# Patient Record
Sex: Male | Born: 1995 | Race: White | Hispanic: No | Marital: Single | Smoking: Current every day smoker
Health system: Southern US, Community
[De-identification: ages and names within clinical notes are randomized; demographics above are authoritative.]

## PROBLEM LIST (undated history)

## (undated) DIAGNOSIS — J069 Acute upper respiratory infection, unspecified: Secondary | ICD-10-CM

## (undated) DIAGNOSIS — R079 Chest pain, unspecified: Secondary | ICD-10-CM

## (undated) HISTORY — DX: Acute upper respiratory infection, unspecified: J06.9

## (undated) HISTORY — DX: Chest pain, unspecified: R07.9

---
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
## 2013-01-01 ENCOUNTER — Encounter: Payer: Self-pay | Admitting: *Deleted

## 2013-01-04 ENCOUNTER — Ambulatory Visit: Payer: Self-pay | Admitting: Family Medicine

## 2013-02-16 ENCOUNTER — Emergency Department (HOSPITAL_BASED_OUTPATIENT_CLINIC_OR_DEPARTMENT_OTHER)
Admission: EM | Admit: 2013-02-16 | Discharge: 2013-02-16 | Disposition: A | Discharge status: Home / Self Care | Payer: Managed Care, Other (non HMO) | Attending: Emergency Medicine | Admitting: Emergency Medicine

## 2013-02-16 ENCOUNTER — Encounter (HOSPITAL_BASED_OUTPATIENT_CLINIC_OR_DEPARTMENT_OTHER): Payer: Self-pay | Admitting: *Deleted

## 2013-02-16 DIAGNOSIS — W261XXA Contact with sword or dagger, initial encounter: Secondary | ICD-10-CM | POA: Insufficient documentation

## 2013-02-16 DIAGNOSIS — S61209A Unspecified open wound of unspecified finger without damage to nail, initial encounter: Secondary | ICD-10-CM | POA: Insufficient documentation

## 2013-02-16 DIAGNOSIS — Z8709 Personal history of other diseases of the respiratory system: Secondary | ICD-10-CM | POA: Insufficient documentation

## 2013-02-16 DIAGNOSIS — Z8679 Personal history of other diseases of the circulatory system: Secondary | ICD-10-CM | POA: Insufficient documentation

## 2013-02-16 DIAGNOSIS — Z23 Encounter for immunization: Secondary | ICD-10-CM | POA: Insufficient documentation

## 2013-02-16 DIAGNOSIS — Y9389 Activity, other specified: Secondary | ICD-10-CM | POA: Insufficient documentation

## 2013-02-16 DIAGNOSIS — F17200 Nicotine dependence, unspecified, uncomplicated: Secondary | ICD-10-CM | POA: Insufficient documentation

## 2013-02-16 DIAGNOSIS — S61219A Laceration without foreign body of unspecified finger without damage to nail, initial encounter: Secondary | ICD-10-CM

## 2013-02-16 DIAGNOSIS — Y9289 Other specified places as the place of occurrence of the external cause: Secondary | ICD-10-CM | POA: Insufficient documentation

## 2013-02-16 DIAGNOSIS — W260XXA Contact with knife, initial encounter: Secondary | ICD-10-CM | POA: Insufficient documentation

## 2013-02-16 MED ORDER — TETANUS-DIPHTH-ACELL PERTUSSIS 5-2.5-18.5 LF-MCG/0.5 IM SUSP
0.5000 mL | Freq: Once | INTRAMUSCULAR | Status: AC
  Administered 2013-02-16: 0.5 mL via INTRAMUSCULAR
  Filled 2013-02-16: qty 0.5

## 2013-02-16 NOTE — ED Provider Notes (Signed)
History     CSN: [id number]  Arrival date & time 02/16/13  7829   First MD Initiated Contact with Patient 02/16/13 2004      Chief Complaint  Patient presents with  . Laceration    (Consider location/radiation/quality/duration/timing/severity/associated sxs/prior treatment) HPI Comments: Pt states that he cut his finger on the zip tie and he couldn't get it to stop bleeding  Patient is a 17 y.o. male presenting with skin laceration. The history is provided by the patient. No language interpreter was used.  Laceration Location:  Finger Finger laceration location:  L index finger Depth:  Cutaneous Injury mechanism: zip tie. Pain details:    Quality:  Aching   Severity:  Mild   Timing:  Constant   Past Medical History  Diagnosis Date  . Chest pain 07/2011    echo normal/ non cardiac unc  . URI (upper respiratory infection)     hospitalized 2005    Past Surgical History  Procedure Laterality Date  . Hand surgery Right 2001    Family History  Problem Relation Age of Onset  . Hypertension Mother   . Lupus Mother   . Anemia Mother   . Cancer Other   . Diabetes Other     History  Substance Use Topics  . Smoking status: Current Every Day Smoker    Types: Cigarettes  . Smokeless tobacco: Current User  . Alcohol Use: No      Review of Systems  Constitutional: Negative.   Respiratory: Negative.   Cardiovascular: Negative.     Allergies  Tylenol  Home Medications   Current Outpatient Rx  Name  Route  Sig  Dispense  Refill  . fish oil-omega-3 fatty acids 1000 MG capsule   Oral   Take 3 g by mouth daily.         Marland Kitchen LYSINE PO   Oral   Take 1 tablet by mouth daily.           BP 140/62  Pulse 78  Temp(Src) 99 F (37.2 C) (Oral)  Resp 18  Ht 5\' 11"  (1.803 m)  Wt 135 lb (61.236 kg)  BMI 18.84 kg/m2  SpO2 100%  Physical Exam  Nursing note and vitals reviewed. Constitutional: He is oriented to person, place, and time. He appears  well-developed and well-nourished.  Cardiovascular: Normal rate and regular rhythm.   Pulmonary/Chest: Effort normal and breath sounds normal.  Musculoskeletal: Normal range of motion.  Neurological: He is alert and oriented to person, place, and time.  Skin:  Pt has a laceration to the distal aspect of the left index finger on the pad of the finger    ED Course  LACERATION REPAIR Date/Time: 02/16/2013 8:26 PM Performed by: Teressa Lower Authorized by: Teressa Lower Consent: Verbal consent obtained. Risks and benefits: risks, benefits and alternatives were discussed Consent given by: patient Patient identity confirmed: verbally with patient Body area: upper extremity Location details: left index finger Laceration length: 1 cm Foreign bodies: no foreign bodies Irrigation solution: saline and tap water Irrigation method: syringe Skin closure: glue   (including critical care time)  Labs Reviewed - No data to display No results found.   1. Finger laceration, initial encounter       MDM  Wound closed without any problem;tetanus updated        Teressa Lower, NP 02/16/13 2029

## 2013-02-16 NOTE — ED Notes (Signed)
Pt reports cut to right index finger by knife x 30 mins ago , bleeding controlled

## 2013-02-17 NOTE — ED Provider Notes (Signed)
Medical screening examination/treatment/procedure(s) were performed by non-physician practitioner and as supervising physician I was immediately available for consultation/collaboration.   Alya Smaltz III, MD 02/17/13 1326 

## 2013-04-21 IMAGING — CR DG HAND COMPLETE 3+V*R*
3 series · 3 of 3 positions shown · non-contrast
Comparison: None.

CLINICAL DATA: Lacerations along the hand.  Pain.

RIGHT HAND - COMPLETE 3+ VIEW

[x hand pa right]
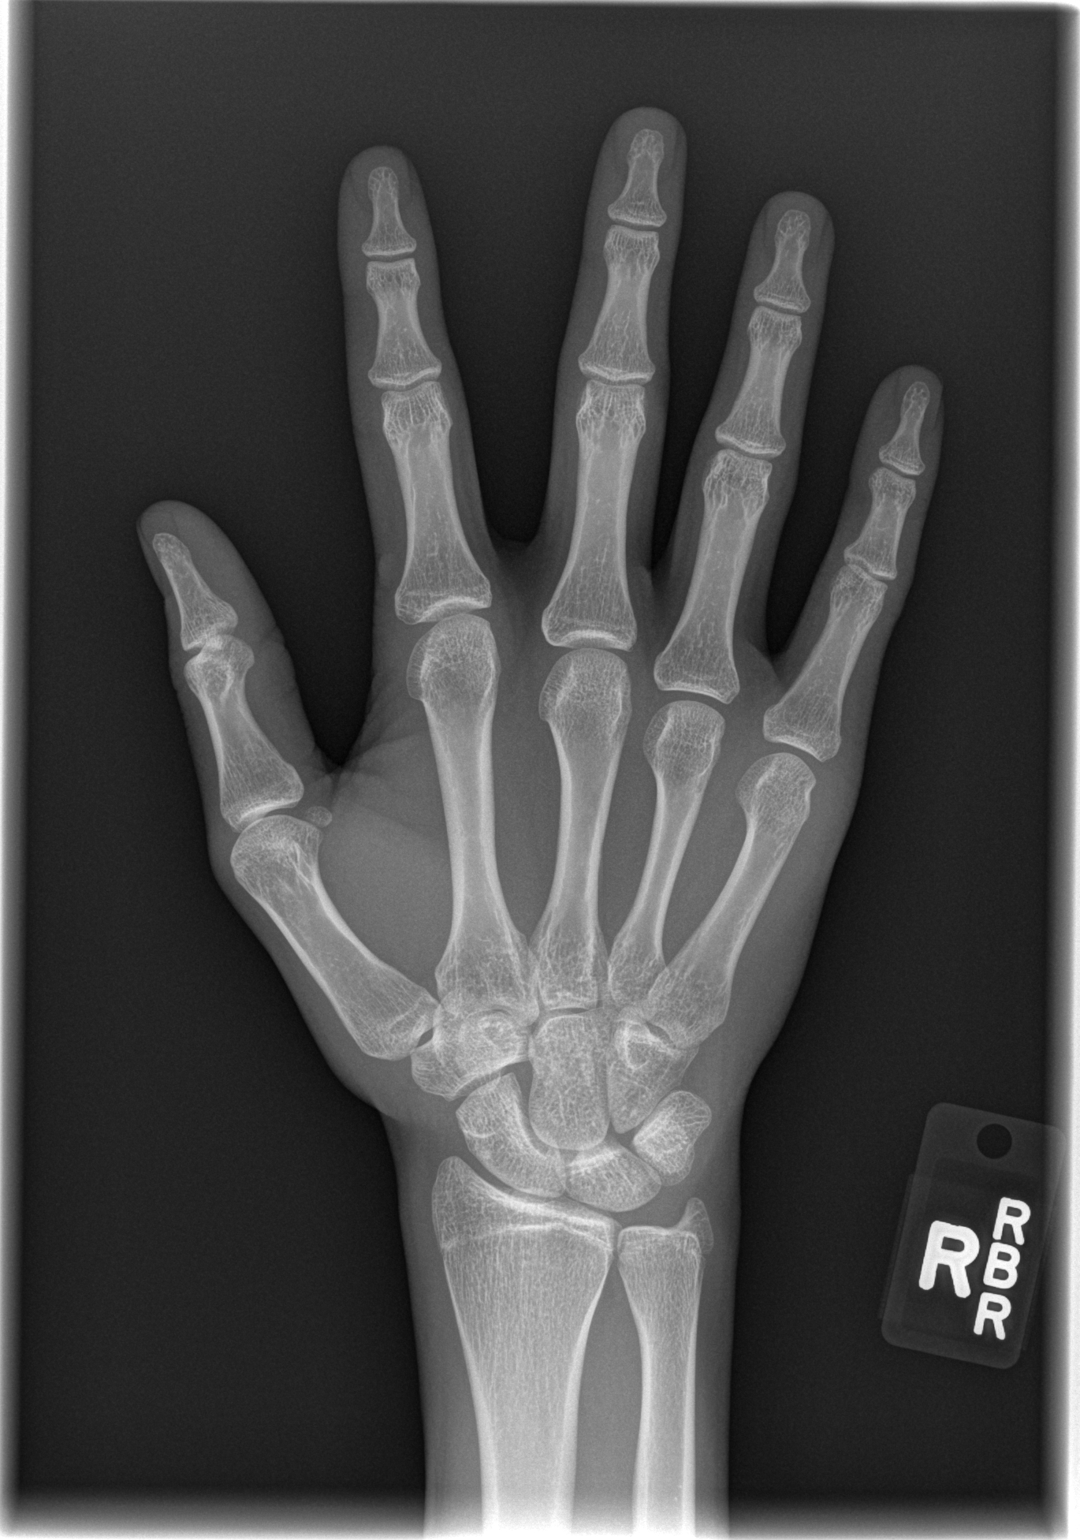

[x hand oblique right]
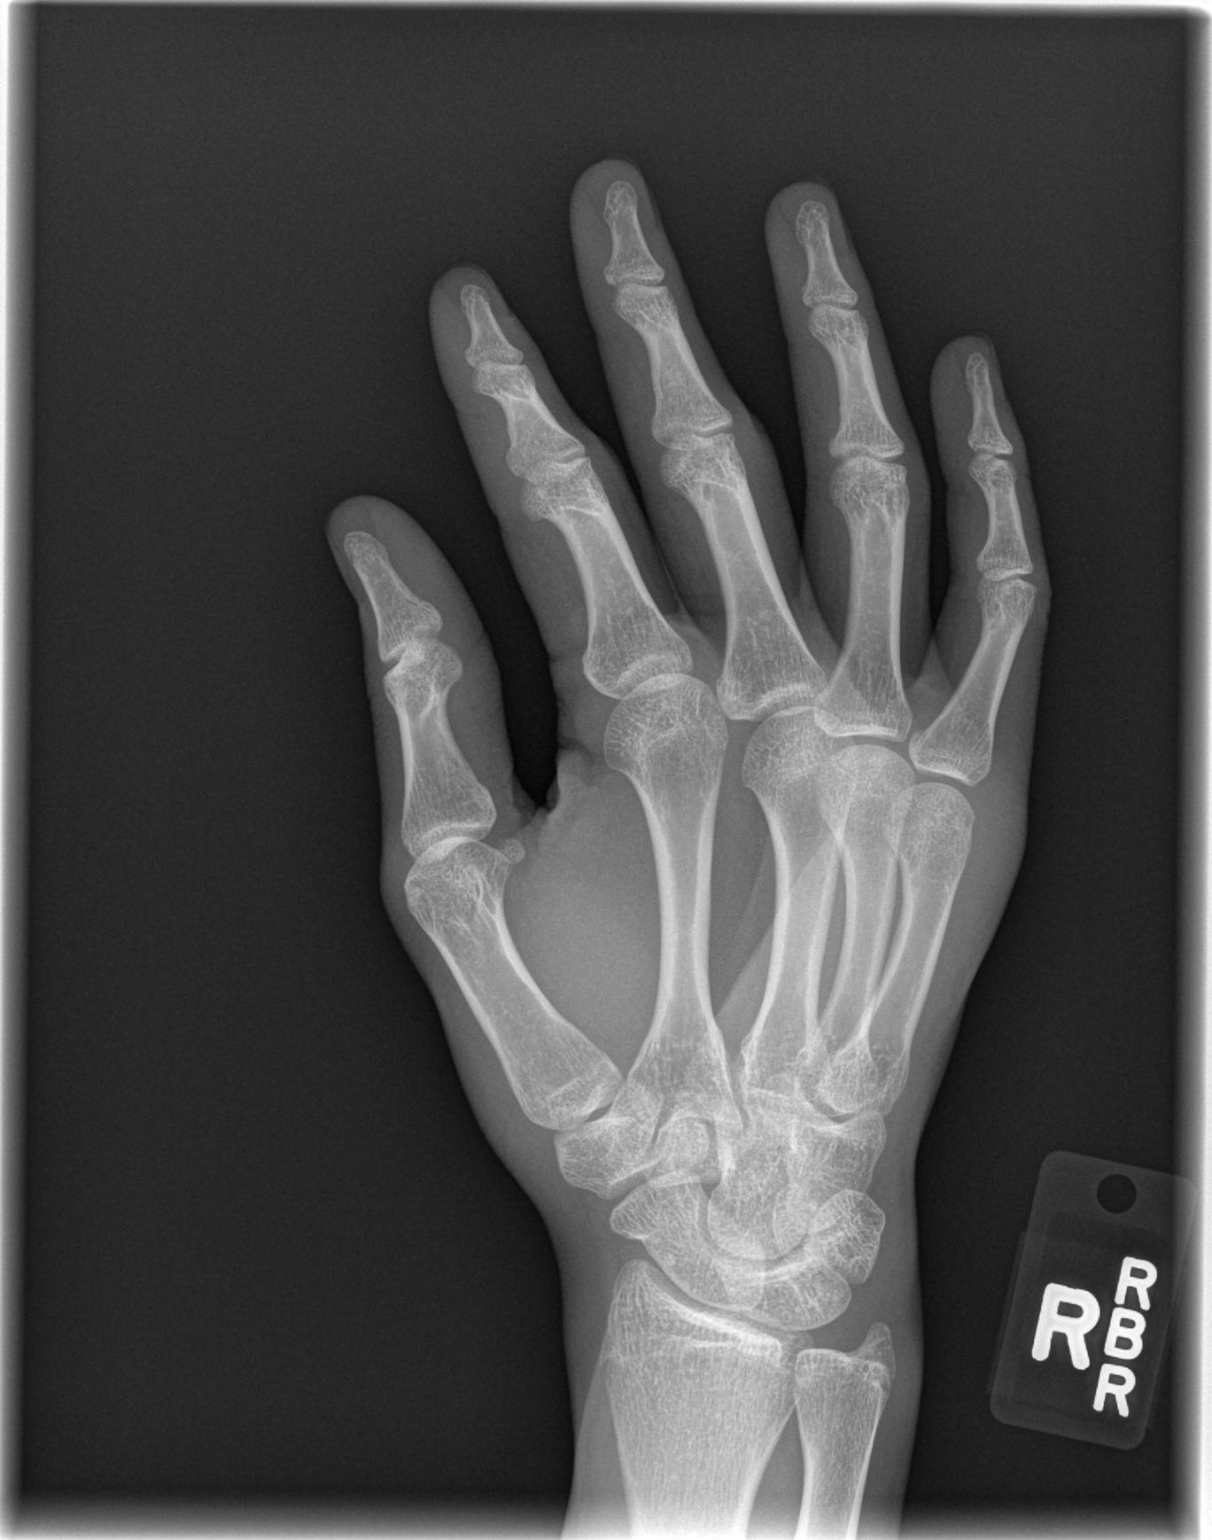

[x hand lat right]
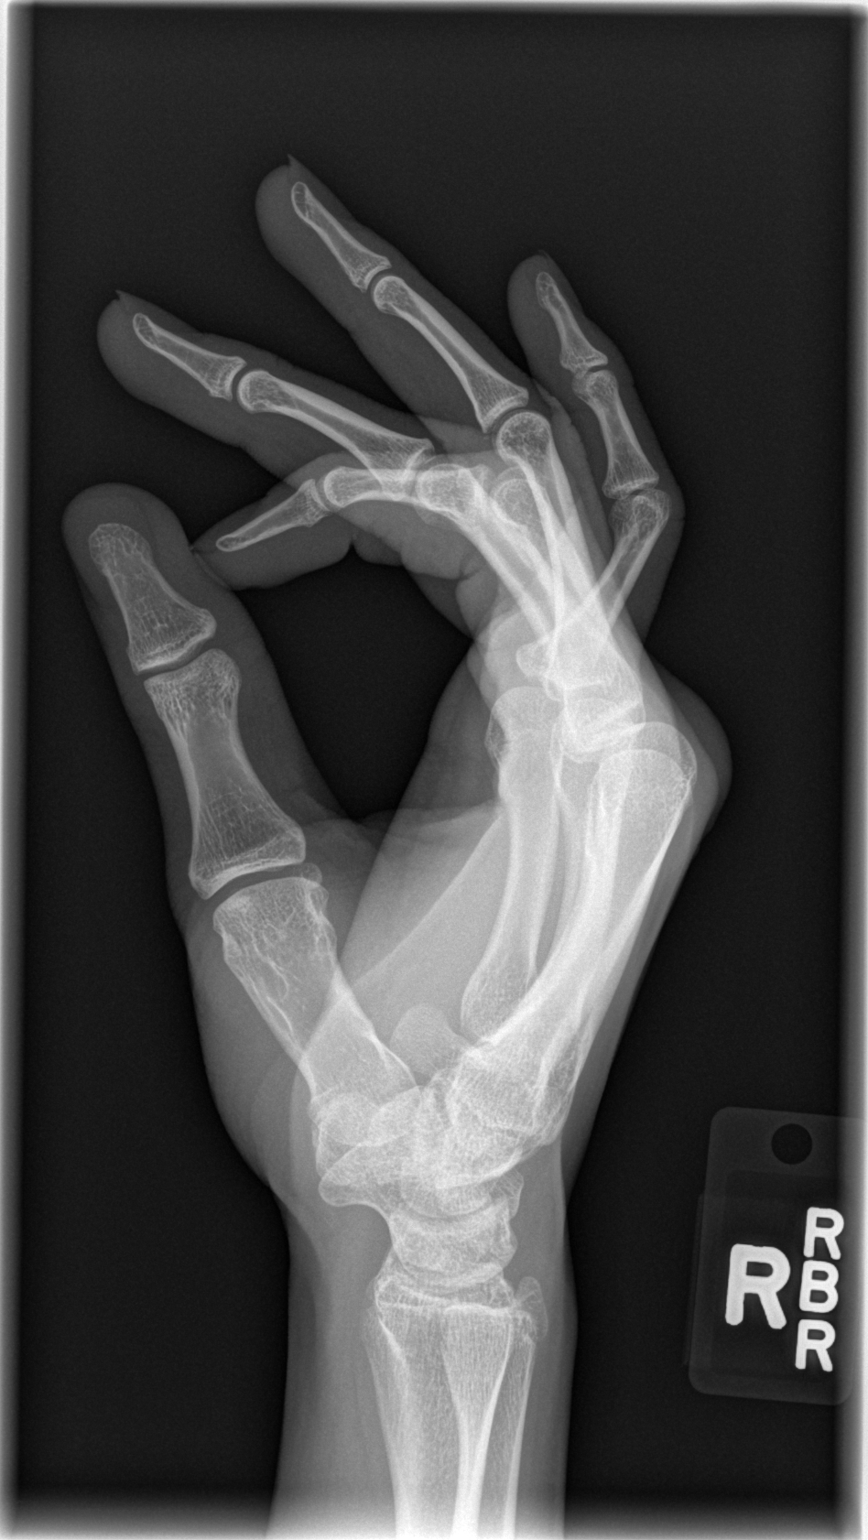

[3 of 3 positions shown; findings below may reference images not displayed]

FINDINGS: No fracture, foreign body, or acute bony findings are
identified.
IMPRESSION: No significant abnormality identified.

## 2013-06-13 ENCOUNTER — Emergency Department (HOSPITAL_BASED_OUTPATIENT_CLINIC_OR_DEPARTMENT_OTHER)
Admission: EM | Admit: 2013-06-13 | Discharge: 2013-06-13 | Disposition: A | Discharge status: Home / Self Care | Payer: Managed Care, Other (non HMO) | Attending: Emergency Medicine | Admitting: Emergency Medicine

## 2013-06-13 ENCOUNTER — Encounter (HOSPITAL_BASED_OUTPATIENT_CLINIC_OR_DEPARTMENT_OTHER): Payer: Self-pay | Admitting: Emergency Medicine

## 2013-06-13 ENCOUNTER — Emergency Department (HOSPITAL_BASED_OUTPATIENT_CLINIC_OR_DEPARTMENT_OTHER): Payer: Managed Care, Other (non HMO)

## 2013-06-13 DIAGNOSIS — Z79899 Other long term (current) drug therapy: Secondary | ICD-10-CM | POA: Insufficient documentation

## 2013-06-13 DIAGNOSIS — F17200 Nicotine dependence, unspecified, uncomplicated: Secondary | ICD-10-CM | POA: Insufficient documentation

## 2013-06-13 DIAGNOSIS — J40 Bronchitis, not specified as acute or chronic: Secondary | ICD-10-CM | POA: Insufficient documentation

## 2013-06-13 DIAGNOSIS — Z8709 Personal history of other diseases of the respiratory system: Secondary | ICD-10-CM | POA: Insufficient documentation

## 2013-06-13 DIAGNOSIS — R05 Cough: Secondary | ICD-10-CM | POA: Insufficient documentation

## 2013-06-13 DIAGNOSIS — R059 Cough, unspecified: Secondary | ICD-10-CM | POA: Insufficient documentation

## 2013-06-13 DIAGNOSIS — J45909 Unspecified asthma, uncomplicated: Secondary | ICD-10-CM | POA: Insufficient documentation

## 2013-06-13 LAB — D-DIMER, QUANTITATIVE: D-Dimer, Quant: <0.27 ug/mL-FEU (ref 0.00–0.48)

## 2013-06-13 MED ORDER — ALBUTEROL SULFATE HFA 108 (90 BASE) MCG/ACT IN AERS: 2.0000 | INHALATION_SPRAY | Freq: Four times a day (QID) | RESPIRATORY_TRACT | Status: DC | PRN

## 2013-06-13 MED ORDER — DOXYCYCLINE HYCLATE 100 MG PO CAPS: 100.0000 mg | ORAL_CAPSULE | Freq: Two times a day (BID) | ORAL | Status: DC

## 2013-06-13 NOTE — ED Notes (Signed)
MD at bedside. 

## 2013-06-13 NOTE — ED Notes (Signed)
Pt c/o productive cough and chest pain x 1 month.

## 2013-06-13 NOTE — ED Notes (Signed)
Pt returned from xray

## 2013-06-13 NOTE — ED Provider Notes (Signed)
Scribed for Glynn Octave, MD, the patient was seen in room MH06/MH06. This chart was scribed by Lewanda Rife, ED scribe. Patient's care was started at 1938  CSN: [id number]     Arrival date & time 06/13/13  1920 History   First MD Initiated Contact with Patient 06/13/13 1935     Chief Complaint  Patient presents with  . Chest Pain   (Consider location/radiation/quality/duration/timing/severity/associated sxs/prior Treatment) The history is provided by the patient.   HPI Comments: Carl Zimmerman is a 17 y.o. male who presents to the Emergency Department complaining of intermittent moderate left-sided chest pain onset 1 month. Reports associated productive cough with yellow-brown sputum. Reports chest pain is aggravated with cough and alleviated when not coughing. Denies associated fever, and shortness of breath. Reports he's smoked cigarettes for 5 years. States he smokes 2 packs a day. Reports hx of asthma, but does not taking any medications for treatment. Past Medical History  Diagnosis Date  . Chest pain 07/2011    echo normal/ non cardiac unc  . URI (upper respiratory infection)     hospitalized 2005   Past Surgical History  Procedure Laterality Date  . Hand surgery Right 2001   Family History  Problem Relation Age of Onset  . Hypertension Mother   . Lupus Mother   . Anemia Mother   . Cancer Other   . Diabetes Other    History  Substance Use Topics  . Smoking status: Current Every Day Smoker -- 2.00 packs/day    Types: Cigarettes  . Smokeless tobacco: Current User  . Alcohol Use: No    Review of Systems  Constitutional: Negative for fever.  Respiratory: Negative for shortness of breath.   Cardiovascular: Positive for chest pain.   A complete 10 system review of systems was obtained and all systems are negative except as noted in the HPI and PMH.    Allergies  Tylenol  Home Medications   Current Outpatient Rx  Name  Route  Sig  Dispense  Refill  .  albuterol (PROVENTIL HFA;VENTOLIN HFA) 108 (90 BASE) MCG/ACT inhaler   Inhalation   Inhale 2 puffs into the lungs every 6 (six) hours as needed for wheezing.   1 Inhaler   2   . doxycycline (VIBRAMYCIN) 100 MG capsule   Oral   Take 1 capsule (100 mg total) by mouth 2 (two) times daily.   20 capsule   0   . fish oil-omega-3 fatty acids 1000 MG capsule   Oral   Take 3 g by mouth daily.         Marland Kitchen LYSINE PO   Oral   Take 1 tablet by mouth daily.          BP 120/79  Pulse 88  Temp(Src) 98.5 F (36.9 C) (Oral)  Resp 16  SpO2 100% Physical Exam  Nursing note and vitals reviewed. Constitutional: He is oriented to person, place, and time. He appears well-developed and well-nourished. No distress.  HENT:  Head: Normocephalic and atraumatic.  Mouth/Throat: Oropharynx is clear and moist. No oropharyngeal exudate.  Eyes: Conjunctivae and EOM are normal. Pupils are equal, round, and reactive to light.  Neck: Normal range of motion. Neck supple. No tracheal deviation present.  Cardiovascular: Normal rate, regular rhythm and normal heart sounds.   No murmur heard. Pulmonary/Chest: Effort normal and breath sounds normal. No respiratory distress. He has no wheezes. He exhibits no tenderness.  Chest pain not reproducible  Abdominal: Soft. There is no tenderness.  Musculoskeletal: Normal range of motion. He exhibits no edema.  Neurological: He is alert and oriented to person, place, and time. No cranial nerve deficit. He exhibits normal muscle tone. Coordination normal.  Skin: Skin is warm and dry.  Psychiatric: He has a normal mood and affect. His behavior is normal.    ED Course  Procedures (including critical care time) Medications - No data to display  Labs Review Labs Reviewed  D-DIMER, QUANTITATIVE   Imaging Review Dg Chest 2 View  06/13/2013   *RADIOLOGY REPORT*  Clinical Data: Productive cough, chest pain  CHEST - 2 VIEW  Comparison: 07/31/2005  Findings:  Normal  cardiac silhouette and mediastinal contours.  No focal parenchymal opacities.  There is mild diffuse slightly nodular thickening of the pulmonary interstitium.  No pleural effusion or pneumothorax.  No evidence of edema.  Unchanged bones.  IMPRESSION: Suspected mild bronchitic change without acute cardiopulmonary disease.  Specifically, no evidence of pneumonia.   Original Report Authenticated By: Tacey Ruiz, MD    MDM   1. Bronchitis    Intermittent chest pain for the past one month. It is substernal it comes on with coughing. Resolves when not coughing. No radiation. Cough is productive of brownish yellow mucus. He smokes 2 packs a day. Denies any fevers, chills, weight loss, night sweats  Chest xray with bronchitis.  EKG normal. D-dimer negative. Pulse ox 100%.  We'll treat for bronchitis. Smoking cessation discussed with patient.   Date: 06/13/2013  Rate: 75  Rhythm: normal sinus rhythm  QRS Axis: normal  Intervals: normal  ST/T Wave abnormalities: normal  Conduction Disutrbances:none  Narrative Interpretation:   Old EKG Reviewed: none available     I personally performed the services described in this documentation, which was scribed in my presence. The recorded information has been reviewed and is accurate.    Glynn Octave, MD 06/13/13 2032

## 2013-07-08 ENCOUNTER — Emergency Department (HOSPITAL_BASED_OUTPATIENT_CLINIC_OR_DEPARTMENT_OTHER)
Admission: EM | Admit: 2013-07-08 | Discharge: 2013-07-09 | Disposition: A | Discharge status: Home / Self Care | Payer: Managed Care, Other (non HMO) | Attending: Emergency Medicine | Admitting: Emergency Medicine

## 2013-07-08 ENCOUNTER — Emergency Department (HOSPITAL_BASED_OUTPATIENT_CLINIC_OR_DEPARTMENT_OTHER): Payer: Managed Care, Other (non HMO)

## 2013-07-08 ENCOUNTER — Encounter (HOSPITAL_BASED_OUTPATIENT_CLINIC_OR_DEPARTMENT_OTHER): Payer: Self-pay | Admitting: *Deleted

## 2013-07-08 DIAGNOSIS — Y929 Unspecified place or not applicable: Secondary | ICD-10-CM | POA: Insufficient documentation

## 2013-07-08 DIAGNOSIS — R079 Chest pain, unspecified: Secondary | ICD-10-CM | POA: Insufficient documentation

## 2013-07-08 DIAGNOSIS — S46912A Strain of unspecified muscle, fascia and tendon at shoulder and upper arm level, left arm, initial encounter: Secondary | ICD-10-CM

## 2013-07-08 DIAGNOSIS — R059 Cough, unspecified: Secondary | ICD-10-CM | POA: Insufficient documentation

## 2013-07-08 DIAGNOSIS — R05 Cough: Secondary | ICD-10-CM | POA: Insufficient documentation

## 2013-07-08 DIAGNOSIS — J45909 Unspecified asthma, uncomplicated: Secondary | ICD-10-CM | POA: Insufficient documentation

## 2013-07-08 DIAGNOSIS — Z792 Long term (current) use of antibiotics: Secondary | ICD-10-CM | POA: Insufficient documentation

## 2013-07-08 DIAGNOSIS — Y9372 Activity, wrestling: Secondary | ICD-10-CM | POA: Insufficient documentation

## 2013-07-08 DIAGNOSIS — Z79899 Other long term (current) drug therapy: Secondary | ICD-10-CM | POA: Insufficient documentation

## 2013-07-08 DIAGNOSIS — F17200 Nicotine dependence, unspecified, uncomplicated: Secondary | ICD-10-CM | POA: Insufficient documentation

## 2013-07-08 DIAGNOSIS — IMO0002 Reserved for concepts with insufficient information to code with codable children: Secondary | ICD-10-CM | POA: Insufficient documentation

## 2013-07-08 MED ORDER — TRAMADOL HCL 50 MG PO TABS: 50.0000 mg | ORAL_TABLET | Freq: Four times a day (QID) | ORAL | Status: DC | PRN

## 2013-07-08 NOTE — ED Notes (Signed)
Pt c/o left shoulder injury x 1 week ago  

## 2013-07-08 NOTE — ED Provider Notes (Signed)
CSN: [id number]     Arrival date & time 07/08/13  2151 History  This chart was scribed for Carl Zimmerman,  by Clydene Laming, ED Scribe. This patient was seen in room MH11/MH11 and the patient's care was started at 10:47 PM.     Chief Complaint  Patient presents with  . Shoulder Injury    The history is provided by the patient. No language interpreter was used.   HPI Comments: Carl Zimmerman is a 17 y.o. male who presents to the Emergency Department complaining of left-sided chest pain with associated shoulder pain onset today post horseplaying with a relative. He states chest pain is worsened with cough. Pt denies SOB. Pt reports Hx of asthma without treatment.      Past Medical History  Diagnosis Date  . Chest pain 07/2011    echo normal/ non cardiac unc  . URI (upper respiratory infection)     hospitalized 2005   Past Surgical History  Procedure Laterality Date  . Hand surgery Right 2001   Family History  Problem Relation Age of Onset  . Hypertension Mother   . Lupus Mother   . Anemia Mother   . Cancer Other   . Diabetes Other    History  Substance Use Topics  . Smoking status: Current Every Day Smoker -- 1.00 packs/day    Types: Cigarettes  . Smokeless tobacco: Current User  . Alcohol Use: No    Review of Systems  Constitutional: Negative for fever and chills.  Musculoskeletal: Negative for joint swelling.  Neurological: Negative for numbness and headaches.  All other systems reviewed and are negative.    Allergies  Review of patient's allergies indicates no known allergies.  Home Medications   Current Outpatient Rx  Name  Route  Sig  Dispense  Refill  . albuterol (PROVENTIL HFA;VENTOLIN HFA) 108 (90 BASE) MCG/ACT inhaler   Inhalation   Inhale 2 puffs into the lungs every 6 (six) hours as needed for wheezing.   1 Inhaler   2   . doxycycline (VIBRAMYCIN) 100 MG capsule   Oral   Take 1 capsule (100 mg total) by mouth 2 (two) times daily.   20  capsule   0   . fish oil-omega-3 fatty acids 1000 MG capsule   Oral   Take 3 g by mouth daily.         Marland Kitchen LYSINE PO   Oral   Take 1 tablet by mouth daily.          Triage Vitals: BP 137/69  Pulse 82  Temp(Src) 98.2 F (36.8 C) (Oral)  Resp 16  Ht 5\' 11"  (1.803 m)  Wt 145 lb (65.772 kg)  BMI 20.23 kg/m2  SpO2 99% Physical Exam  Constitutional: He is oriented to person, place, and time. He appears well-developed and well-nourished. No distress.  HENT:  Head: Normocephalic and atraumatic.  Right Ear: Hearing normal.  Left Ear: Hearing normal.  Nose: Nose normal.  Mouth/Throat: Oropharynx is clear and moist and mucous membranes are normal.  Eyes: Conjunctivae and EOM are normal. Pupils are equal, round, and reactive to light.  Neck: Normal range of motion. Neck supple.  Cardiovascular: Regular rhythm, S1 normal and S2 normal.  Exam reveals no gallop and no friction rub.   No murmur heard. Pulmonary/Chest: Effort normal and breath sounds normal. No respiratory distress. He exhibits no tenderness.  Abdominal: Soft. Normal appearance and bowel sounds are normal. There is no hepatosplenomegaly. There is no tenderness. There  is no rebound, no guarding, no tenderness at McBurney's point and negative Murphy's sign. No hernia.  Musculoskeletal: Normal range of motion.  Neurological: He is alert and oriented to person, place, and time. He has normal strength. No cranial nerve deficit or sensory deficit. Coordination normal. GCS eye subscore is 4. GCS verbal subscore is 5. GCS motor subscore is 6.  Skin: Skin is warm, dry and intact. No rash noted. No cyanosis.  Psychiatric: He has a normal mood and affect. His speech is normal and behavior is normal. Thought content normal.    ED Course  Procedures (including critical care time)  DIAGNOSTIC STUDIES: Oxygen Saturation is 99% on RA, normal by my interpretation.    COORDINATION OF CARE: 10:50PM- Discussed treatment plan with pt at  bedside. Pt verbalized understanding and agreement with plan.   Labs Review Labs Reviewed - No data to display Imaging Review Dg Shoulder Left  07/08/2013   *RADIOLOGY REPORT*  Clinical Data: Shoulder injury 1 week ago.  LEFT SHOULDER - 2+ VIEW  Comparison: None.  Findings: There is no acute fracture or dislocation.  AC joint is approximated.  Humeral head is in normal alignment glenoid.  No periarticular soft tissue calcification.  Osseous mineralization is normal.  Partially visualized left hemithorax is clear.  IMPRESSION: No acute osseous abnormality identified about the left shoulder.   Original Report Authenticated By: Rise Mu, M.D.    MDM  Diagnosis: Shoulder Strain  Patient presents to the ER for evaluation of left shoulder pain. Patient reports he injured it a week ago when wrestling with his girlfriend. Since then the pain has worsened. Patient reports severe pain in the shoulder but worsens with movement. He was concerned that was dislocated. There was no deformity noted and x-ray was unremarkable.  I personally performed the services described in this documentation, which was scribed in my presence. The recorded information has been reviewed and is accurate.   Carl Crease, MD 07/08/13 [phone]

## 2014-03-25 ENCOUNTER — Emergency Department (HOSPITAL_BASED_OUTPATIENT_CLINIC_OR_DEPARTMENT_OTHER)
Admission: EM | Admit: 2014-03-25 | Discharge: 2014-03-25 | Disposition: A | Discharge status: Home / Self Care | Payer: Managed Care, Other (non HMO) | Attending: Emergency Medicine | Admitting: Emergency Medicine

## 2014-03-25 ENCOUNTER — Encounter (HOSPITAL_BASED_OUTPATIENT_CLINIC_OR_DEPARTMENT_OTHER): Payer: Self-pay | Admitting: Emergency Medicine

## 2014-03-25 DIAGNOSIS — F17200 Nicotine dependence, unspecified, uncomplicated: Secondary | ICD-10-CM | POA: Insufficient documentation

## 2014-03-25 DIAGNOSIS — J029 Acute pharyngitis, unspecified: Secondary | ICD-10-CM | POA: Insufficient documentation

## 2014-03-25 DIAGNOSIS — Z79899 Other long term (current) drug therapy: Secondary | ICD-10-CM | POA: Insufficient documentation

## 2014-03-25 LAB — RAPID STREP SCREEN (MED CTR MEBANE ONLY): Streptococcus, Group A Screen (Direct): NEGATIVE

## 2014-03-25 MED ORDER — IBUPROFEN 800 MG PO TABS: 800.0000 mg | ORAL_TABLET | Freq: Three times a day (TID) | ORAL | Status: DC

## 2014-03-25 NOTE — ED Provider Notes (Signed)
CSN: [id number]     Arrival date & time 03/25/14  1621 History   First MD Initiated Contact with Patient 03/25/14 1659     Chief Complaint  Patient presents with  . Sore Throat     (Consider location/radiation/quality/duration/timing/severity/associated sxs/prior Treatment) Patient is a 18 y.o. male presenting with pharyngitis. The history is provided by the patient. No language interpreter was used.  Sore Throat This is a new problem. The current episode started today. The problem occurs constantly. The problem has been unchanged. Associated symptoms include a fever and a sore throat. Nothing aggravates the symptoms. He has tried nothing for the symptoms. The treatment provided moderate relief.    Past Medical History  Diagnosis Date  . Chest pain 07/2011    echo normal/ non cardiac unc  . URI (upper respiratory infection)     hospitalized 2005   Past Surgical History  Procedure Laterality Date  . Hand surgery Right 2001   Family History  Problem Relation Age of Onset  . Hypertension Mother   . Lupus Mother   . Anemia Mother   . Cancer Other   . Diabetes Other    History  Substance Use Topics  . Smoking status: Current Every Day Smoker -- 1.00 packs/day    Types: Cigarettes  . Smokeless tobacco: Current User  . Alcohol Use: No    Review of Systems  Constitutional: Positive for fever.  HENT: Positive for rhinorrhea and sore throat.   All other systems reviewed and are negative.     Allergies  Review of patient's allergies indicates no known allergies.  Home Medications   Prior to Admission medications   Medication Sig Start Date End Date Taking? Authorizing Provider  albuterol (PROVENTIL HFA;VENTOLIN HFA) 108 (90 BASE) MCG/ACT inhaler Inhale 2 puffs into the lungs every 6 (six) hours as needed for wheezing. 06/13/13   Glynn Octave, MD  doxycycline (VIBRAMYCIN) 100 MG capsule Take 1 capsule (100 mg total) by mouth 2 (two) times daily. 06/13/13   Glynn Octave, MD  fish oil-omega-3 fatty acids 1000 MG capsule Take 3 g by mouth daily.    Historical Provider, MD  LYSINE PO Take 1 tablet by mouth daily.    Historical Provider, MD  traMADol (ULTRAM) 50 MG tablet Take 1 tablet (50 mg total) by mouth every 6 (six) hours as needed for pain. 07/08/13   Gilda Crease, MD   BP 132/74  Pulse 118  Temp(Src) 99.2 F (37.3 C) (Oral)  Resp 18  Ht 5\' 11"  (1.803 m)  Wt 145 lb (65.772 kg)  BMI 20.23 kg/m2  SpO2 99% Physical Exam  Nursing note and vitals reviewed. Constitutional: He is oriented to person, place, and time. He appears well-developed and well-nourished.  HENT:  Head: Normocephalic.  Eyes: Conjunctivae and EOM are normal. Pupils are equal, round, and reactive to light.  Neck: Normal range of motion.  Cardiovascular: Normal rate.   Pulmonary/Chest: Effort normal.  Musculoskeletal: Normal range of motion.  Neurological: He is oriented to person, place, and time.  Skin: Skin is warm.  Psychiatric: He has a normal mood and affect.    ED Course  Procedures (including critical care time) Labs Review Labs Reviewed  RAPID STREP SCREEN  CULTURE, GROUP A STREP    Imaging Review No results found.   EKG Interpretation None      MDM   Final diagnoses:  Pharyngitis    Ibuprofen      Elson Areas, PA-C 03/25/14 1754

## 2014-03-25 NOTE — Discharge Instructions (Signed)
Sore Throat A sore throat is pain, burning, irritation, or scratchiness of the throat. There is often pain or tenderness when swallowing or talking. A sore throat may be accompanied by other symptoms, such as coughing, sneezing, fever, and swollen neck glands. A sore throat is often the first sign of another sickness, such as a cold, flu, strep throat, or mononucleosis (commonly known as mono). Most sore throats go away without medical treatment. CAUSES  The most common causes of a sore throat include:  A viral infection, such as a cold, flu, or mono.  A bacterial infection, such as strep throat, tonsillitis, or whooping cough.  Seasonal allergies.  Dryness in the air.  Irritants, such as smoke or pollution.  Gastroesophageal reflux disease (GERD). HOME CARE INSTRUCTIONS   Only take over-the-counter medicines as directed by your caregiver.  Drink enough fluids to keep your urine clear or pale yellow.  Rest as needed.  Try using throat sprays, lozenges, or sucking on hard candy to ease any pain (if older than 4 years or as directed).  Sip warm liquids, such as broth, herbal tea, or warm water with honey to relieve pain temporarily. You may also eat or drink cold or frozen liquids such as frozen ice pops.  Gargle with salt water (mix 1 tsp salt with 8 oz of water).  Do not smoke and avoid secondhand smoke.  Put a cool-mist humidifier in your bedroom at night to moisten the air. You can also turn on a hot shower and sit in the bathroom with the door closed for 5 10 minutes. SEEK IMMEDIATE MEDICAL CARE IF:  You have difficulty breathing.  You are unable to swallow fluids, soft foods, or your saliva.  You have increased swelling in the throat.  Your sore throat does not get better in 7 days.  You have nausea and vomiting.  You have a fever or persistent symptoms for more than 2 3 days.  You have a fever and your symptoms suddenly get worse. MAKE SURE YOU:   Understand  these instructions.  Will watch your condition.  Will get help right away if you are not doing well or get worse. Document Released: 11/13/2004 Document Revised: 09/22/2012 Document Reviewed: 06/13/2012 [employer] Patient Information 2014 [employer].  

## 2014-03-25 NOTE — ED Notes (Signed)
C/o sore throat, body aches, and fever since yesterday.  Denies [place], vomiting, diarrhea.

## 2014-03-27 LAB — CULTURE, GROUP A STREP

## 2014-04-12 NOTE — ED Provider Notes (Signed)
Medical screening examination/treatment/procedure(s) were performed by non-physician practitioner and as supervising physician I was immediately available for consultation/collaboration.   EKG Interpretation None        Rolland PorterMark Delmas Faucett, MD 04/12/14 [phone]

## 2014-08-02 ENCOUNTER — Encounter: Payer: Self-pay | Admitting: Family Medicine

## 2014-08-02 ENCOUNTER — Ambulatory Visit (INDEPENDENT_AMBULATORY_CARE_PROVIDER_SITE_OTHER): Payer: Managed Care, Other (non HMO) | Admitting: Family Medicine

## 2014-08-02 VITALS — BP 120/80 | Temp 97.9°F | Ht 69.0 in | Wt 125.1 lb

## 2014-08-02 DIAGNOSIS — J683 Other acute and subacute respiratory conditions due to chemicals, gases, fumes and vapors: Secondary | ICD-10-CM

## 2014-08-02 DIAGNOSIS — J4520 Mild intermittent asthma, uncomplicated: Secondary | ICD-10-CM

## 2014-08-02 MED ORDER — AZITHROMYCIN 250 MG PO TABS: ORAL_TABLET | ORAL | Status: DC

## 2014-08-02 MED ORDER — ALBUTEROL SULFATE HFA 108 (90 BASE) MCG/ACT IN AERS: 2.0000 | INHALATION_SPRAY | Freq: Four times a day (QID) | RESPIRATORY_TRACT | Status: DC | PRN

## 2014-08-02 NOTE — Progress Notes (Signed)
   Subjective:    Patient ID: Carl Zimmerman, male    DOB: 06/25/1996, 18 y.o.   MRN: [id number]  Cough This is a new problem. The current episode started in the past 7 days. The problem has been unchanged. The problem occurs constantly. The cough is productive of sputum. Associated symptoms include chest pain and wheezing. Associated symptoms comments: Congestion, vomiting. Nothing aggravates the symptoms. He has tried nothing for the symptoms. The treatment provided no relief.   Patient states that he has no other concerns at this time.   Patient notes wheezing at times. Has history of asthma as a young child.  Cough productive at times.  No fever or chills Review of Systems  Respiratory: Positive for cough and wheezing.   Cardiovascular: Positive for chest pain.       Objective:   Physical Exam  Alert vitals stable. HEENT moderate nasal congestion lungs bilateral wheezes heart regular in rhythm      Assessment & Plan:  Impression acute bronchitis with exacerbation of reactive airways plan encouraged to stop smoking. Z-Pak. Albuterol when necessary. WSL

## 2014-08-30 IMAGING — CR DG CHEST 2V
2 series · 2 of 2 positions shown · non-contrast
Comparison: 07/31/2005

CLINICAL DATA: Productive cough, chest pain

CHEST - 2 VIEW

[w chest pa]
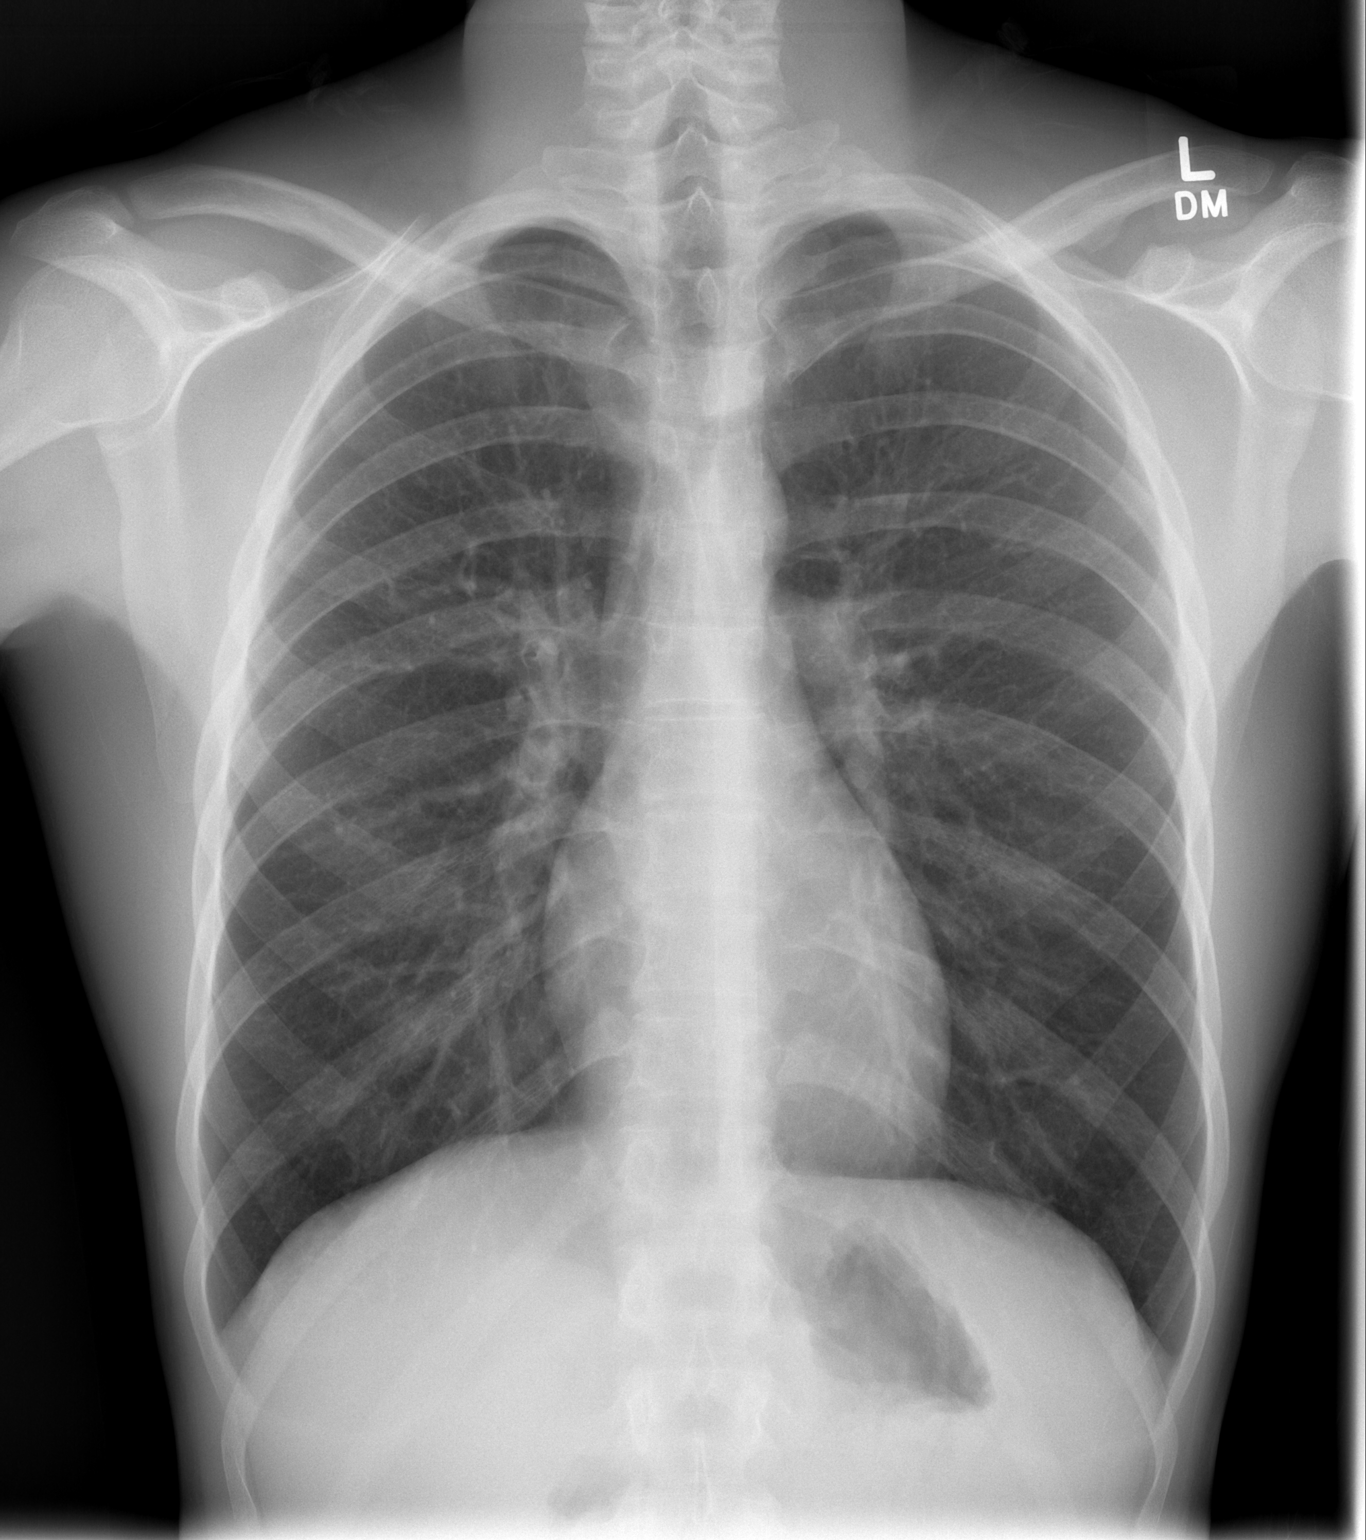

[w chest lat]
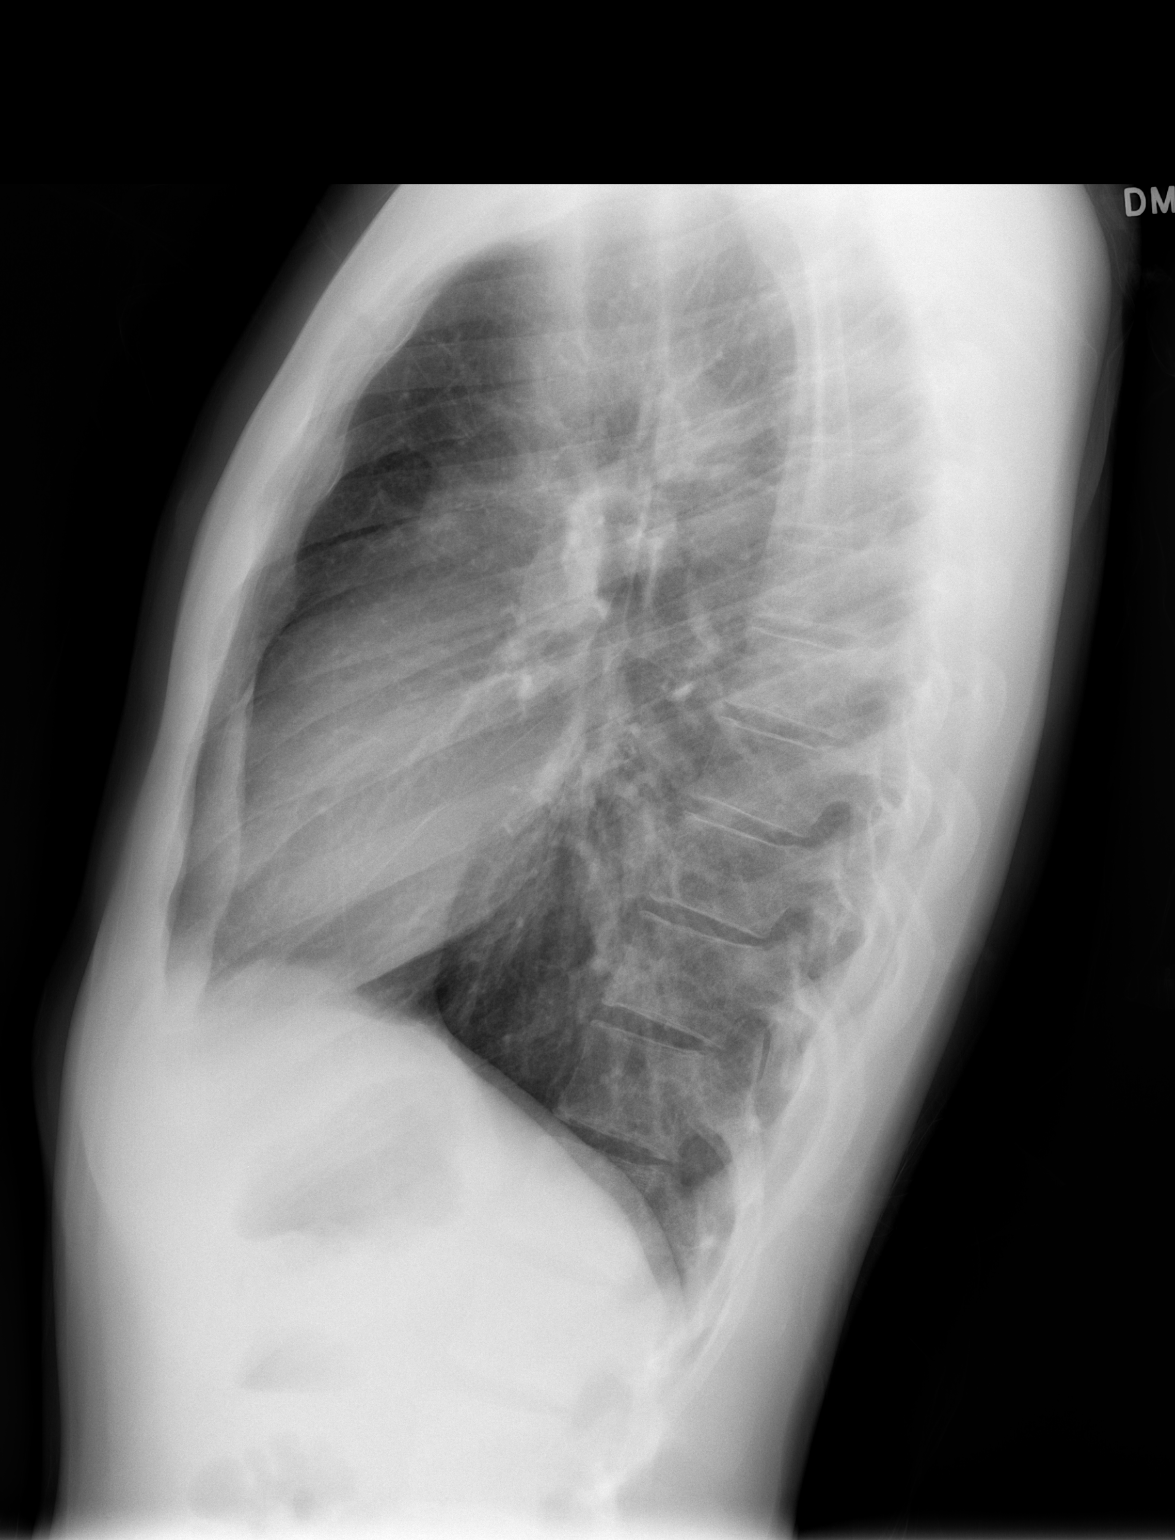

[2 of 2 positions shown; findings below may reference images not displayed]

FINDINGS: Normal cardiac silhouette and mediastinal contours.  No focal
parenchymal opacities.  There is mild diffuse slightly nodular
thickening of the pulmonary interstitium.  No pleural effusion or
pneumothorax.  No evidence of edema.  Unchanged bones.
IMPRESSION: Suspected mild bronchitic change without acute cardiopulmonary
disease.  Specifically, no evidence of pneumonia.

## 2015-10-21 ENCOUNTER — Emergency Department (HOSPITAL_BASED_OUTPATIENT_CLINIC_OR_DEPARTMENT_OTHER)
Admission: EM | Admit: 2015-10-21 | Discharge: 2015-10-21 | Disposition: A | Discharge status: Home / Self Care | Payer: Managed Care, Other (non HMO) | Attending: Emergency Medicine | Admitting: Emergency Medicine

## 2015-10-21 ENCOUNTER — Encounter (HOSPITAL_BASED_OUTPATIENT_CLINIC_OR_DEPARTMENT_OTHER): Payer: Self-pay | Admitting: Emergency Medicine

## 2015-10-21 DIAGNOSIS — N342 Other urethritis: Secondary | ICD-10-CM | POA: Insufficient documentation

## 2015-10-21 DIAGNOSIS — F17210 Nicotine dependence, cigarettes, uncomplicated: Secondary | ICD-10-CM | POA: Insufficient documentation

## 2015-10-21 DIAGNOSIS — Z8709 Personal history of other diseases of the respiratory system: Secondary | ICD-10-CM | POA: Insufficient documentation

## 2015-10-21 LAB — URINALYSIS, ROUTINE W REFLEX MICROSCOPIC
Glucose, UA: NEGATIVE mg/dL
HGB URINE DIPSTICK: NEGATIVE
Ketones, ur: NEGATIVE mg/dL
NITRITE: NEGATIVE
PROTEIN: NEGATIVE mg/dL
Specific Gravity, Urine: 1.029 (ref 1.005–1.030)
pH: 6 (ref 5.0–8.0)

## 2015-10-21 LAB — URINE MICROSCOPIC-ADD ON

## 2015-10-21 MED ORDER — CEFTRIAXONE SODIUM 250 MG IJ SOLR
250.0000 mg | Freq: Once | INTRAMUSCULAR | Status: AC
  Administered 2015-10-21: 250 mg via INTRAMUSCULAR
  Filled 2015-10-21: qty 250

## 2015-10-21 MED ORDER — AZITHROMYCIN 250 MG PO TABS
1000.0000 mg | ORAL_TABLET | Freq: Once | ORAL | Status: AC
  Administered 2015-10-21: 1000 mg via ORAL
  Filled 2015-10-21: qty 4

## 2015-10-21 MED ORDER — LIDOCAINE HCL (PF) 1 % IJ SOLN
INTRAMUSCULAR | Status: AC
Start: 2015-10-21 — End: 2015-10-21
  Administered 2015-10-21: 5 mL
  Filled 2015-10-21: qty 5

## 2015-10-21 NOTE — Discharge Instructions (Signed)
Urethritis, Adult °Urethritis is an inflammation of the tube through which urine exits your bladder (urethra).  °CAUSES °Urethritis is often caused by an infection in your urethra. The infection can be viral, like herpes. The infection can also be bacterial, like gonorrhea. °RISK FACTORS °Risk factors of urethritis include: °· Having sex without using a condom. °· Having multiple sexual partners. °· Having poor hygiene. °SIGNS AND SYMPTOMS °Symptoms of urethritis are less noticeable in women than in men. These symptoms include: °· Burning feeling when you urinate (dysuria). °· Discharge from your urethra. °· Blood in your urine (hematuria). °· Urinating more than usual. °DIAGNOSIS  °To confirm a diagnosis of urethritis, your health care provider will do the following: °· Ask about your sexual history. °· Perform a physical exam. °· Have you provide a sample of your urine for lab testing. °· Use a cotton swab to gently collect a sample from your urethra for lab testing. °TREATMENT  °It is important to treat urethritis. Depending on the cause, untreated urethritis may lead to serious genital infections and possibly infertility. Urethritis caused by a bacterial infection is treated with antibiotic medicine. All sexual partners must be treated.  °HOME CARE INSTRUCTIONS °· Do not have sex until the test results are known and treatment is completed, even if your symptoms go away before you finish treatment. °· If you were prescribed an antibiotic, finish it all even if you start to feel better. °SEEK MEDICAL CARE IF:  °· Your symptoms are not improved in 3 days. °· Your symptoms are getting worse. °· You develop abdominal pain or pelvic pain (in women). °· You develop joint pain. °· You have a fever. °SEEK IMMEDIATE MEDICAL CARE IF:  °· You have severe pain in the belly, back, or side. °· You have repeated vomiting. °MAKE SURE YOU: °· Understand these instructions. °· Will watch your condition. °· Will get help right away  if you are not doing well or get worse. °  °This information is not intended to replace advice given to you by your health care provider. Make sure you discuss any questions you have with your health care provider. °  °Document Released: 04/01/2001 Document Revised: 02/20/2015 Document Reviewed: 06/06/2013 °Elsevier Interactive Patient Education ©2016 [employer] ° °

## 2015-10-21 NOTE — ED Notes (Signed)
Pain with urination x 1week, reports unprotected sex

## 2015-10-21 NOTE — ED Provider Notes (Signed)
CSN: [id number]     Arrival date & time 10/21/15  1022 History   First MD Initiated Contact with Patient 10/21/15 1024     Chief Complaint  Patient presents with  . Exposure to STD     (Consider location/radiation/quality/duration/timing/severity/associated sxs/prior Treatment) HPI  20 year old male with dysuria for 1 week. States he has recently had unprotected sex. Patient denies any discharge but has noticed redness at the tip of his penis. Denies any abdominal pain or penile pain except at the tip. No discharge. Does not know if any contacts have had STDs. No blood in his urine. Patient states he has no pain unless urinating.  Past Medical History  Diagnosis Date  . Chest pain 07/2011    echo normal/ non cardiac unc  . URI (upper respiratory infection)     hospitalized 2005   Past Surgical History  Procedure Laterality Date  . Hand surgery Right 2001   Family History  Problem Relation Age of Onset  . Hypertension Mother   . Lupus Mother   . Anemia Mother   . Cancer Other   . Diabetes Other    Social History  Substance Use Topics  . Smoking status: Current Every Day Smoker -- 1.00 packs/day    Types: Cigarettes  . Smokeless tobacco: Current User  . Alcohol Use: No    Review of Systems  Constitutional: Negative for fever.  Genitourinary: Positive for dysuria and penile pain. Negative for discharge, penile swelling, scrotal swelling and testicular pain.  All other systems reviewed and are negative.     Allergies  Tylenol  Home Medications   Prior to Admission medications   Not on File   BP 132/83 mmHg  Pulse 101  Temp(Src) 98.6 F (37 C) (Oral)  Resp 20  Ht 5\' 10"  (1.778 m)  Wt 140 lb (63.504 kg)  BMI 20.09 kg/m2  SpO2 100% Physical Exam  Constitutional: He is oriented to person, place, and time. He appears well-developed and well-nourished.  HENT:  Head: Normocephalic and atraumatic.  Right Ear: External ear normal.  Left Ear: External ear  normal.  Nose: Nose normal.  Eyes: Right eye exhibits no discharge. Left eye exhibits no discharge.  Neck: Neck supple.  Pulmonary/Chest: Effort normal.  Abdominal: Soft. There is no tenderness.  Genitourinary: Right testis shows no mass and no tenderness. Left testis shows no mass and no tenderness. Circumcised. Penile erythema (at the tip of his urethra) present. No discharge found.  Musculoskeletal: He exhibits no edema.  Neurological: He is alert and oriented to person, place, and time.  Skin: Skin is warm and dry.  Nursing note and vitals reviewed.   ED Course  Procedures (including critical care time) Labs Review Labs Reviewed  URINALYSIS, ROUTINE W REFLEX MICROSCOPIC (NOT AT [facility]) - Abnormal; Notable for the following:    Color, Urine AMBER (*)    Bilirubin Urine SMALL (*)    Leukocytes, UA SMALL (*)    All other components within normal limits  URINE MICROSCOPIC-ADD ON - Abnormal; Notable for the following:    Squamous Epithelial / LPF 0-5 (*)    Bacteria, UA MANY (*)    All other components within normal limits  GC/CHLAMYDIA PROBE AMP () NOT AT [facility]    Imaging Review No results found. I have personally reviewed and evaluated these images and lab results as part of my medical decision-making.   EKG Interpretation None      MDM   Final diagnoses:  Urethritis  Patient with symptomatic urethritis. He does not have discharge but does have erythema at the tip of his penis. No abdominal tenderness, suprapubic pain, or frequency to suggest UTI. He does have bacteria and small leukocytes in his urine but I think this is more from the urethritis. Treated for GC/chlamydia with Rocephin and azithromycin. He declines HIV testing.    Pricilla LovelessScott Shamyra Farias, MD 10/21/15 1600

## 2015-10-21 NOTE — ED Notes (Signed)
Pt demonstrated no signs or symptoms of side effects from prev medications administered earlier

## 2015-10-21 NOTE — ED Notes (Signed)
DC instructions reviewed with pt, opportunity for questions provided, pt teaching done re: safe sex practices.

## 2015-10-23 LAB — GC/CHLAMYDIA PROBE AMP (~~LOC~~) NOT AT ARMC
CHLAMYDIA, DNA PROBE: POSITIVE — AB
Neisseria Gonorrhea: POSITIVE — AB

## 2015-10-24 ENCOUNTER — Telehealth (HOSPITAL_COMMUNITY): Payer: Self-pay

## 2015-10-24 NOTE — Telephone Encounter (Signed)
Positive for gonorrhea and chlamydia. Treated per protocol. [employer] form faxed. Attempting to contact. Unable to reach by telephone. Letter sent to address on record.  

## 2015-11-17 ENCOUNTER — Telehealth (HOSPITAL_COMMUNITY): Payer: Self-pay

## 2015-11-17 NOTE — Telephone Encounter (Signed)
Unable to contact pt by mail or telephone. Unable to communicate lab results or treatment changes. 

## 2017-11-08 ENCOUNTER — Emergency Department (HOSPITAL_BASED_OUTPATIENT_CLINIC_OR_DEPARTMENT_OTHER)
Admission: EM | Admit: 2017-11-08 | Discharge: 2017-11-08 | Discharge status: Against Medical Advice | Payer: Managed Care, Other (non HMO) | Attending: Emergency Medicine | Admitting: Emergency Medicine

## 2017-11-08 ENCOUNTER — Encounter (HOSPITAL_BASED_OUTPATIENT_CLINIC_OR_DEPARTMENT_OTHER): Payer: Self-pay | Admitting: Emergency Medicine

## 2017-11-08 ENCOUNTER — Other Ambulatory Visit: Payer: Self-pay

## 2017-11-08 DIAGNOSIS — Z5321 Procedure and treatment not carried out due to patient leaving prior to being seen by health care provider: Secondary | ICD-10-CM | POA: Insufficient documentation

## 2017-11-08 DIAGNOSIS — L0231 Cutaneous abscess of buttock: Secondary | ICD-10-CM | POA: Insufficient documentation

## 2017-11-08 NOTE — ED Triage Notes (Signed)
Abscess to R buttock.

## 2017-11-08 NOTE — ED Notes (Signed)
Called in waiting room x1, no answer 

## 2017-11-08 NOTE — ED Notes (Signed)
Called in waiting room, no answer

## 2023-02-19 DIAGNOSIS — R42 Dizziness and giddiness: Secondary | ICD-10-CM | POA: Diagnosis not present

## 2023-02-19 DIAGNOSIS — R110 Nausea: Secondary | ICD-10-CM | POA: Diagnosis not present

## 2023-02-19 DIAGNOSIS — R531 Weakness: Secondary | ICD-10-CM | POA: Diagnosis not present

## 2023-02-19 DIAGNOSIS — R112 Nausea with vomiting, unspecified: Secondary | ICD-10-CM | POA: Diagnosis not present

## 2023-02-19 DIAGNOSIS — E860 Dehydration: Secondary | ICD-10-CM | POA: Diagnosis not present

## 2023-03-30 DIAGNOSIS — K047 Periapical abscess without sinus: Secondary | ICD-10-CM | POA: Diagnosis not present

## 2023-04-01 DIAGNOSIS — Z720 Tobacco use: Secondary | ICD-10-CM | POA: Diagnosis not present

## 2023-04-01 DIAGNOSIS — K029 Dental caries, unspecified: Secondary | ICD-10-CM | POA: Diagnosis not present

## 2023-04-01 DIAGNOSIS — F17210 Nicotine dependence, cigarettes, uncomplicated: Secondary | ICD-10-CM | POA: Diagnosis not present

## 2023-04-01 DIAGNOSIS — K0889 Other specified disorders of teeth and supporting structures: Secondary | ICD-10-CM | POA: Diagnosis not present

## 2023-04-02 DIAGNOSIS — F17200 Nicotine dependence, unspecified, uncomplicated: Secondary | ICD-10-CM | POA: Diagnosis not present

## 2023-04-02 DIAGNOSIS — K047 Periapical abscess without sinus: Secondary | ICD-10-CM | POA: Diagnosis not present

## 2023-04-02 DIAGNOSIS — K1379 Other lesions of oral mucosa: Secondary | ICD-10-CM | POA: Diagnosis not present

## 2023-05-20 DIAGNOSIS — K2970 Gastritis, unspecified, without bleeding: Secondary | ICD-10-CM | POA: Diagnosis not present

## 2023-05-20 DIAGNOSIS — K047 Periapical abscess without sinus: Secondary | ICD-10-CM | POA: Diagnosis not present

## 2023-05-20 DIAGNOSIS — F17200 Nicotine dependence, unspecified, uncomplicated: Secondary | ICD-10-CM | POA: Diagnosis not present

## 2023-05-20 DIAGNOSIS — Z1331 Encounter for screening for depression: Secondary | ICD-10-CM | POA: Diagnosis not present
